# Patient Record
Sex: Male | Born: 1970 | Race: White | Hispanic: No | Marital: Married | Smoking: Never smoker
Health system: Southern US, Community
[De-identification: ages and names within clinical notes are randomized; demographics above are authoritative.]

## PROBLEM LIST (undated history)

## (undated) DIAGNOSIS — R937 Abnormal findings on diagnostic imaging of other parts of musculoskeletal system: Secondary | ICD-10-CM

## (undated) DIAGNOSIS — M1990 Unspecified osteoarthritis, unspecified site: Secondary | ICD-10-CM

## (undated) DIAGNOSIS — R519 Headache, unspecified: Secondary | ICD-10-CM

## (undated) DIAGNOSIS — R161 Splenomegaly, not elsewhere classified: Secondary | ICD-10-CM

## (undated) DIAGNOSIS — K219 Gastro-esophageal reflux disease without esophagitis: Secondary | ICD-10-CM

## (undated) DIAGNOSIS — R51 Headache: Secondary | ICD-10-CM

## (undated) DIAGNOSIS — T7840XA Allergy, unspecified, initial encounter: Secondary | ICD-10-CM

## (undated) DIAGNOSIS — B019 Varicella without complication: Secondary | ICD-10-CM

## (undated) DIAGNOSIS — N281 Cyst of kidney, acquired: Secondary | ICD-10-CM

## (undated) DIAGNOSIS — G43909 Migraine, unspecified, not intractable, without status migrainosus: Secondary | ICD-10-CM

## (undated) HISTORY — DX: Gastro-esophageal reflux disease without esophagitis: K21.9

## (undated) HISTORY — PX: NO PAST SURGERIES: SHX2092

## (undated) HISTORY — DX: Splenomegaly, not elsewhere classified: R16.1

## (undated) HISTORY — DX: Headache: R51

## (undated) HISTORY — DX: Migraine, unspecified, not intractable, without status migrainosus: G43.909

## (undated) HISTORY — DX: Allergy, unspecified, initial encounter: T78.40XA

## (undated) HISTORY — DX: Cyst of kidney, acquired: N28.1

## (undated) HISTORY — DX: Headache, unspecified: R51.9

## (undated) HISTORY — DX: Unspecified osteoarthritis, unspecified site: M19.90

## (undated) HISTORY — DX: Varicella without complication: B01.9

## (undated) HISTORY — DX: Abnormal findings on diagnostic imaging of other parts of musculoskeletal system: R93.7

---

## 2014-05-23 DIAGNOSIS — M222X9 Patellofemoral disorders, unspecified knee: Secondary | ICD-10-CM | POA: Insufficient documentation

## 2014-05-23 DIAGNOSIS — M1990 Unspecified osteoarthritis, unspecified site: Secondary | ICD-10-CM | POA: Insufficient documentation

## 2014-05-23 DIAGNOSIS — M7652 Patellar tendinitis, left knee: Secondary | ICD-10-CM | POA: Insufficient documentation

## 2015-08-22 ENCOUNTER — Encounter: Payer: Self-pay | Admitting: Podiatry

## 2015-08-22 ENCOUNTER — Ambulatory Visit (INDEPENDENT_AMBULATORY_CARE_PROVIDER_SITE_OTHER): Payer: No Typology Code available for payment source

## 2015-08-22 ENCOUNTER — Ambulatory Visit (INDEPENDENT_AMBULATORY_CARE_PROVIDER_SITE_OTHER): Payer: No Typology Code available for payment source | Admitting: Podiatry

## 2015-08-22 VITALS — BP 155/79 | HR 69 | Resp 18

## 2015-08-22 DIAGNOSIS — R52 Pain, unspecified: Secondary | ICD-10-CM

## 2015-08-22 DIAGNOSIS — M109 Gout, unspecified: Secondary | ICD-10-CM

## 2015-08-22 DIAGNOSIS — M10071 Idiopathic gout, right ankle and foot: Secondary | ICD-10-CM

## 2015-08-22 MED ORDER — TRAMADOL HCL 50 MG PO TABS
50.0000 mg | ORAL_TABLET | Freq: Three times a day (TID) | ORAL | Status: DC | PRN
Start: 2015-08-22 — End: 2018-07-05

## 2015-08-22 MED ORDER — METHYLPREDNISOLONE 4 MG PO TBPK: ORAL_TABLET | ORAL | Status: DC

## 2015-08-22 NOTE — Progress Notes (Signed)
   Subjective:    Patient ID: Scott Herrera, male    DOB: 12-22-1970, 44 y.o.   MRN: [id number]  HPI  24-year-old male presents the office as concerns her right ankle pain and swelling which has been ongoing for the last couple of days. He states his ankles been very painful particularly weightbearing and pressure in pain returned and his ankle joint. A painful, swollen and slightly red. He is concerned that he may have gout. He states his been tested previously for gout has been negative although he does get reoccurring swelling painful what appears to be gout flares.he denies any history of injury or,. No tingling or numbness. The pain does not wake him up in my. No other complaints at this time.   Review of Systems  Constitutional: Positive for activity change.  HENT: Positive for sinus pressure and trouble swallowing.   Endocrine:       Excessive thirst  Musculoskeletal: Positive for back pain and gait problem.       Joint pain  Allergic/Immunologic: Positive for environmental allergies.  All other systems reviewed and are negative.      Objective:   Physical Exam AAO x3, NAD DP/PT pulses palpable bilaterally, CRT less than 3 seconds Protective sensation intact with Simms Weinstein monofilament, vibratory sensation intact, Achilles tendon reflex intact There is tenderness to palpation along both the medial and lateral aspect of the posterior to the medial and lateral malleolus. There is no discomfort on the course of the Achilles tendon no defect is noted. Thompson test is negative. There is moderate edema to the ankle in slight increase in warmth compared to the contralateral extremity. There is pain with ankle joint range of motion. No pain with subtalar joint range of motion. There is no specific area pinpoint bony tenderness or pain the vibratory sensation. No otherareas of tenderness to bilateral lower extremities. MMT 4/5 on the right and 5/5 on the left, ROM WNL except for right  ankle which is limited due to pain. No open lesions or pre-ulcerative lesions.  No overlying edema, erythema, increase in warmth to bilateral lower extremities.  No pain with calf compression, swelling, warmth, erythema bilaterally.       Assessment & Plan:  44 year old male right ankle swelling, pain, likely gout -Treatment options discussed including all alternatives, risks, and complications -X-rays were obtained and reviewed with the patient.  -Discussed likely etiology of his symptoms. -Ordered CBC, uric acid, ESR, CRP. -Prescribed a Medrol Dosepak. Start this after lab work is obtained. -Prescribed Voltaren due to pain. -he are to have the cam boot, he can wear as needed. -Follow-up in 2 weeks or sooner if any problems arise. In the meantime, encouraged to call the office with any questions, concerns, change in symptoms.   Celesta Gentile, DPM

## 2015-08-22 NOTE — Patient Instructions (Signed)
Gout °Gout is when your joints become red, sore, and swell (inflamed). This is caused by the buildup of uric acid crystals in the joints. Uric acid is a chemical that is normally in the blood. If the level of uric acid gets too high in the blood, these crystals form in your joints and tissues. Over time, these crystals can form into masses near the joints and tissues. These masses can destroy bone and cause the bone to look misshapen (deformed). °HOME CARE  °· Do not take aspirin for pain. °· Only take medicine as told by your doctor. °· Rest the joint as much as you can. When in bed, keep sheets and blankets off painful areas. °· Keep the sore joints raised (elevated). °· Put warm or cold packs on painful joints. Use of warm or cold packs depends on which works best for you. °· Use crutches if the painful joint is in your leg. °· Drink enough fluids to keep your pee (urine) clear or pale yellow. Limit alcohol, sugary drinks, and drinks with fructose in them. °· Follow your diet instructions. Pay careful attention to how much protein you eat. Include fruits, vegetables, whole grains, and fat-free or low-fat milk products in your daily diet. Talk to your doctor or dietitian about the use of coffee, vitamin C, and cherries. These may help lower uric acid levels. °· Keep a healthy body weight. °GET HELP RIGHT AWAY IF:  °· You have watery poop (diarrhea), throw up (vomit), or have any side effects from medicines. °· You do not feel better in 24 hours, or you are getting worse. °· Your joint becomes suddenly more tender, and you have chills or a fever. °MAKE SURE YOU:  °· Understand these instructions. °· Will watch your condition. °· Will get help right away if you are not doing well or get worse. °Document Released: 09/01/2008 Document Revised: 04/09/2014 Document Reviewed: 07/06/2012 °[employer]® Patient Information ©2015 [employer]. This information is not intended to replace advice given to you by your health care  provider. Make sure you discuss any questions you have with your health care provider. ° °

## 2015-08-23 LAB — CBC WITH DIFFERENTIAL/PLATELET
Basophils Absolute: 0 10*3/uL (ref 0.0–0.2)
Basos: 0 %
EOS (ABSOLUTE): 0.4 10*3/uL (ref 0.0–0.4)
Eos: 4 %
Hematocrit: 43.5 % (ref 37.5–51.0)
Hemoglobin: 15.4 g/dL (ref 12.6–17.7)
Immature Grans (Abs): 0 10*3/uL (ref 0.0–0.1)
Immature Granulocytes: 0 %
Lymphocytes Absolute: 3.1 10*3/uL (ref 0.7–3.1)
Lymphs: 33 %
MCH: 30.8 pg (ref 26.6–33.0)
MCHC: 35.4 g/dL (ref 31.5–35.7)
MCV: 87 fL (ref 79–97)
Monocytes Absolute: 0.8 10*3/uL (ref 0.1–0.9)
Monocytes: 9 %
Neutrophils Absolute: 5 10*3/uL (ref 1.4–7.0)
Neutrophils: 54 %
Platelets: 282 10*3/uL (ref 150–379)
RBC: 5 x10E6/uL (ref 4.14–5.80)
RDW: 14.1 % (ref 12.3–15.4)
WBC: 9.3 10*3/uL (ref 3.4–10.8)

## 2015-08-23 LAB — C-REACTIVE PROTEIN: CRP: 7.3 mg/L — ABNORMAL HIGH (ref 0.0–4.9)

## 2015-08-23 LAB — URIC ACID: Uric Acid: 8.6 mg/dL (ref 3.7–8.6)

## 2015-08-23 LAB — SEDIMENTATION RATE: Sed Rate: 2 mm/hr (ref 0–15)

## 2015-08-26 ENCOUNTER — Telehealth: Payer: Self-pay | Admitting: *Deleted

## 2015-08-26 NOTE — Telephone Encounter (Addendum)
Pt called request bloodwork results from 08/22/2015, gout test.  Dr. Ardelle Anton states values are upper normal likely gout, continue medication.  Called [phone], informed pt's wife, Amy of Dr. Gabriel Rung review of labs and to continue the medication.  Amy states pt, was up all weekend for his son's wedding and has only had a little improvement, will likely rest more now.

## 2015-08-28 ENCOUNTER — Telehealth: Payer: Self-pay | Admitting: *Deleted

## 2015-08-28 ENCOUNTER — Encounter: Payer: Self-pay | Admitting: Podiatry

## 2015-08-28 ENCOUNTER — Ambulatory Visit (INDEPENDENT_AMBULATORY_CARE_PROVIDER_SITE_OTHER): Payer: No Typology Code available for payment source | Admitting: Podiatry

## 2015-08-28 VITALS — BP 143/91 | HR 63 | Temp 97.9°F | Resp 18

## 2015-08-28 DIAGNOSIS — M109 Gout, unspecified: Secondary | ICD-10-CM

## 2015-08-28 DIAGNOSIS — M10071 Idiopathic gout, right ankle and foot: Secondary | ICD-10-CM | POA: Diagnosis not present

## 2015-08-28 MED ORDER — COLCHICINE 0.6 MG PO TABS
0.6000 mg | ORAL_TABLET | Freq: Every day | ORAL | Status: DC
Start: 2015-08-28 — End: 2015-08-28

## 2015-08-28 MED ORDER — COLCHICINE 0.6 MG PO TABS: 0.6000 mg | ORAL_TABLET | Freq: Every day | ORAL | Status: DC

## 2015-08-28 NOTE — Telephone Encounter (Addendum)
Pt's wife, Amy states pt has just completed the steroid dose pack, and this am the foot is extremely painful and swollen.  She asked if pt needed to come in.  I referred wife's message to [facility] and she got pt in to see Dr. Al Corpus today.

## 2015-08-28 NOTE — Progress Notes (Signed)
He presents today for follow-up of his gout to his right foot. He states that after being on the sterile and it helped for a little while but as he ended the sterilely packed his pain returned. He states that he really has no risks for gout and that he has no family history he does not drink and he takes no medications. He states that he has good kidney function per his primary doctor states that the right foot is swollen around the ankle particularly posterior lateral aspect and is painful. He states that this has been going on for years now and is starting to stay longer where as it used to only hang around for 3 days. He states that it has really never been red hot and swollen just swollen and painful. He had blood work performed the last time he was here.  Objective: Vital signs are stable he is alert and oriented 3. Pulses are strongly palpable. Neurologic sensorium is intact. Deep tendon reflexes are intact and brisk bilateral. Muscle strength is normal bilateral. Orthopedic evaluation demonstrates all joints distally for range of motion without crepitus he has moderate edema around the right ankle and over the peroneal tendons. He also has pain on palpation of the deep posterior lateral ankle joint. Review of radiographs today demonstrate ankle joint effusion. Blood work demonstrated a high normal uric acid which very well could be consistent with pain.  Assessment: Gouty capsulitis right ankle.  Plan: Discussed etiology pathology conservative or surgical therapies. I injected the area today with Kenalog and local and aesthetic. I also placed him on colchicine and requested follow-up with him in 2-3 weeks.

## 2015-09-11 ENCOUNTER — Ambulatory Visit: Payer: No Typology Code available for payment source | Admitting: Podiatry

## 2015-11-12 ENCOUNTER — Other Ambulatory Visit: Payer: Self-pay | Admitting: Orthopedic Surgery

## 2015-11-12 DIAGNOSIS — S76302A Unspecified injury of muscle, fascia and tendon of the posterior muscle group at thigh level, left thigh, initial encounter: Secondary | ICD-10-CM

## 2015-12-04 ENCOUNTER — Ambulatory Visit: Payer: No Typology Code available for payment source

## 2016-07-15 ENCOUNTER — Ambulatory Visit (INDEPENDENT_AMBULATORY_CARE_PROVIDER_SITE_OTHER): Payer: BLUE CROSS/BLUE SHIELD | Admitting: Podiatry

## 2016-07-15 ENCOUNTER — Ambulatory Visit (INDEPENDENT_AMBULATORY_CARE_PROVIDER_SITE_OTHER): Payer: BLUE CROSS/BLUE SHIELD

## 2016-07-15 ENCOUNTER — Ambulatory Visit: Payer: No Typology Code available for payment source | Admitting: Podiatry

## 2016-07-15 ENCOUNTER — Encounter: Payer: Self-pay | Admitting: Podiatry

## 2016-07-15 DIAGNOSIS — M10071 Idiopathic gout, right ankle and foot: Secondary | ICD-10-CM

## 2016-07-15 DIAGNOSIS — M109 Gout, unspecified: Secondary | ICD-10-CM

## 2016-07-15 MED ORDER — ALLOPURINOL 100 MG PO TABS: 100.0000 mg | ORAL_TABLET | Freq: Two times a day (BID) | ORAL | 6 refills | Status: DC

## 2016-07-15 MED ORDER — COLCHICINE 0.6 MG PO TABS: ORAL_TABLET | ORAL | 3 refills | Status: DC

## 2016-07-15 MED ORDER — COLCHICINE 0.6 MG PO TABS: 0.6000 mg | ORAL_TABLET | Freq: Every day | ORAL | 3 refills | Status: DC

## 2016-07-15 NOTE — Progress Notes (Signed)
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 States that over the past year he has had multiple flareups and utilizes his colchicine during that time. He recalls that we discussed a medication that he can take to prevent the gout from flaring.  Objective: Vital signs are stable he is alert and oriented 3. Pulses are palpable. No change from previous evaluation a see no open lesions or wounds. Mild tenderness on palpation of the first metatarsophalangeal joint but the majority of his pain from gout was around the ankle.  Assessment: Gouty capsulitis.  Plan: Start him on 200 mg of allopurinol and colchicine. I will follow-up with him as needed. He is notify us with a flare.

## 2018-07-05 ENCOUNTER — Ambulatory Visit (INDEPENDENT_AMBULATORY_CARE_PROVIDER_SITE_OTHER): Payer: BLUE CROSS/BLUE SHIELD | Admitting: Internal Medicine

## 2018-07-05 ENCOUNTER — Encounter: Payer: Self-pay | Admitting: Internal Medicine

## 2018-07-05 VITALS — BP 138/71 | HR 64 | Temp 98.4°F | Ht 71.0 in | Wt 242.8 lb

## 2018-07-05 DIAGNOSIS — M5136 Other intervertebral disc degeneration, lumbar region: Secondary | ICD-10-CM

## 2018-07-05 DIAGNOSIS — R161 Splenomegaly, not elsewhere classified: Secondary | ICD-10-CM

## 2018-07-05 DIAGNOSIS — M25511 Pain in right shoulder: Secondary | ICD-10-CM

## 2018-07-05 DIAGNOSIS — M545 Low back pain, unspecified: Secondary | ICD-10-CM

## 2018-07-05 DIAGNOSIS — M109 Gout, unspecified: Secondary | ICD-10-CM

## 2018-07-05 DIAGNOSIS — M549 Dorsalgia, unspecified: Secondary | ICD-10-CM

## 2018-07-05 DIAGNOSIS — M25512 Pain in left shoulder: Secondary | ICD-10-CM

## 2018-07-05 DIAGNOSIS — M2550 Pain in unspecified joint: Secondary | ICD-10-CM | POA: Diagnosis not present

## 2018-07-05 DIAGNOSIS — M5126 Other intervertebral disc displacement, lumbar region: Secondary | ICD-10-CM

## 2018-07-05 DIAGNOSIS — Z1329 Encounter for screening for other suspected endocrine disorder: Secondary | ICD-10-CM

## 2018-07-05 DIAGNOSIS — G8929 Other chronic pain: Secondary | ICD-10-CM

## 2018-07-05 DIAGNOSIS — Z1159 Encounter for screening for other viral diseases: Secondary | ICD-10-CM

## 2018-07-05 DIAGNOSIS — Z23 Encounter for immunization: Secondary | ICD-10-CM | POA: Diagnosis not present

## 2018-07-05 DIAGNOSIS — R1310 Dysphagia, unspecified: Secondary | ICD-10-CM

## 2018-07-05 DIAGNOSIS — Z1389 Encounter for screening for other disorder: Secondary | ICD-10-CM | POA: Diagnosis not present

## 2018-07-05 DIAGNOSIS — M51369 Other intervertebral disc degeneration, lumbar region without mention of lumbar back pain or lower extremity pain: Secondary | ICD-10-CM | POA: Insufficient documentation

## 2018-07-05 DIAGNOSIS — E559 Vitamin D deficiency, unspecified: Secondary | ICD-10-CM

## 2018-07-05 DIAGNOSIS — Z8739 Personal history of other diseases of the musculoskeletal system and connective tissue: Secondary | ICD-10-CM

## 2018-07-05 DIAGNOSIS — N281 Cyst of kidney, acquired: Secondary | ICD-10-CM

## 2018-07-05 DIAGNOSIS — Z0184 Encounter for antibody response examination: Secondary | ICD-10-CM

## 2018-07-05 NOTE — Progress Notes (Signed)
Pre visit review using our clinic review tool, if applicable. No additional management support is needed unless otherwise documented below in the visit note. 

## 2018-07-05 NOTE — Progress Notes (Signed)
Chief Complaint  Patient presents with  . Establish Care   New patient with wife  1. Had MRI lumbar 06/20/18 for chronic low back pain f/u [facility] on Robaxin and Naproxen  2. C/o b/l shoulder pain and low back pain. 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 H/o gout not taking allopurinol nor cochicine and no active gout flare  3. C/o dysphagia with dry meats and feeling like getting "choked" or "airlock" of food esp meats and breath. He will have difficulty swallowing to pt where he cant breath and has to try walking to get it to go down throat.  He declines GI referral for now but agreeable to do Barium swallow  4. Mild splenomegaly noted on MRI lumbar he reports occasionally he drinks 1-2 beers. Also noted large left kidney cyst on MRI lumbar denies blood in urine    Review of Systems  Constitutional: Negative for weight loss.  HENT: Negative for hearing loss.   Eyes: Negative for blurred vision.  Respiratory: Negative for shortness of breath.   Cardiovascular: Negative for chest pain.  Gastrointestinal: Negative for abdominal pain.  Genitourinary: Negative for hematuria.  Musculoskeletal: Positive for back pain, joint pain and myalgias.  Skin: Negative for rash.  Neurological: Negative for headaches.  Psychiatric/Behavioral: Negative for depression.   Past Medical History:  Diagnosis Date  . Abnormal MRI, lumbar spine   . Allergy   . Arthritis   . Chicken pox   . Cyst of left kidney   . Frequent headaches   . GERD (gastroesophageal reflux disease)   . Migraine   . Splenomegaly    Past Surgical History:  Procedure Laterality Date  . NO PAST SURGERIES     Family History  Problem Relation Age of Onset  . Cancer Father        colostomy ? cause   . Asthma Daughter   . Asthma Son   . Arthritis Maternal Grandmother   . Cancer Maternal Grandmother        breast and colon   . Birth defects Maternal Grandmother   . Cancer  Maternal Grandfather        prostate   . Hearing loss Maternal Grandfather    Social History   Socioeconomic History  . Marital status: Married    Spouse name: Not on file  . Number of children: Not on file  . Years of education: Not on file  . Highest education level: Not on file  Occupational History  . Not on file  Social Needs  . Financial resource strain: Not on file  . Food insecurity:    Worry: Not on file    Inability: Not on file  . Transportation needs:    Medical: Not on file    Non-medical: Not on file  Tobacco Use  . Smoking status: Never Smoker  . Smokeless tobacco: Never Used  Substance and Sexual Activity  . Alcohol use: No    Alcohol/week: 0.0 oz  . Drug use: No  . Sexual activity: Yes    Comment: wife   Lifestyle  . Physical activity:    Days per week: Not on file    Minutes per session: Not on file  . Stress: Not on file  Relationships  . Social connections:    Talks on phone: Not on file    Gets together: Not on file    Attends religious service: Not on file  Active member of club or organization: Not on file    Attends meetings of clubs or organizations: Not on file    Relationship status: Not on file  . Intimate partner violence:    Fear of current or ex partner: Not on file    Emotionally abused: Not on file    Physically abused: Not on file    Forced sexual activity: Not on file  Other Topics Concern  . Not on file  Social History Narrative   Drinks etoh sparingly    Never smoker    Owns gusn    Does not wear seat belt    Safe in relationship    2 kids boy and girl, married    [employer]s [employer] 12[employer] grade [employer]   Current Meds  Medication Sig  . methocarbamol (ROBAXIN) 500 MG tablet Take 500 mg by mouth 2 (two) times daily.  . naproxen (NAPROSYN) 375 MG tablet Take 375 mg by mouth 2 (two) times daily.   No Known Allergies No results found for this or any previous visit (from the past 2160 hour(s)). Objective  Body mass index  is 33.86 kg/m. Wt Readings from Last 3 Encounters:  07/05/18 242 lb 12.8 oz (110.1 kg)   Temp Readings from Last 3 Encounters:  07/05/18 98.4 F (36.9 C) (Oral)  08/28/15 97.9 F (36.6 C)   BP Readings from Last 3 Encounters:  07/05/18 138/71  08/28/15 (!) 143/91  08/22/15 (!) 155/79   Pulse Readings from Last 3 Encounters:  07/05/18 64  08/28/15 63  08/22/15 69    Physical Exam  Constitutional: He is oriented to person, place, and time. Vital signs are normal. He appears well-developed and well-nourished. He is cooperative.  HENT:  Head: Normocephalic and atraumatic.  Mouth/Throat: Oropharynx is clear and moist and mucous membranes are normal.  Eyes: Pupils are equal, round, and reactive to light. Conjunctivae are normal.  Cardiovascular: Normal rate, regular rhythm and normal heart sounds.  Pulmonary/Chest: Effort normal and breath sounds normal.  Abdominal: Soft. Normal appearance and bowel sounds are normal. He exhibits no distension. There is no splenomegaly. There is no tenderness.  No splenomegaly on exam   Neurological: He is alert and oriented to person, place, and time. Gait normal.  Skin: Skin is warm, dry and intact.  Psychiatric: He has a normal mood and affect. His speech is normal and behavior is normal. Judgment and thought content normal. Cognition and memory are normal.  Nursing note and vitals reviewed.   Assessment   1. Mild splenomegaly noted on MRI lumbar 06/20/18. tx'ed steroids by ortho 06/2018 x 1 week   2. Large cyst in left kidney noted MRI lumbar 06/20/18    3. Chronic low back pain DDD and herniated discs -06/20/18 MRI lumbar multilevel DDD, mild facet arthropathy, disc bulging multiple levels with spinal canal narrowing L2/3 through L4/5 most pronounced L3/4. Mild neural foraminal narrowing L5-S1, disc bulge L5/S1 abuts descending S1 nerve roods b/l   4. H/o gout no active flare   5. H/o b/l shoulder pain   6. Dysphagia   7. HM Plan   1  and 2. CT ab/pelvis with and w/o contrast  Check labs today  3.  F/u [facility]  Prn meds  On robaxin 500 mg qhs prn and naproxen 375 bid  Disc reduce heavy lifting  4.  Not taking allopurinol 10 mg bid or colchicine 0.6  5. Check labs if continues f/u [facility]  6. Check barium  swallow  Declines GI EGD for now  7.  Declines flu shot  Tdap given today  Check MMR and hep b/C status  Declines HIV   Will need to check fasting lipid in future  Consider PSA and colonoscopy age 64  Last eye exam 2016 [facility]  Dentist Dr. Leighton Roach  Neurology [facility] Prior PCP Dr. Gus Puma [facility]    "I spent 60 minutes face-to face with patient with greater than 50% of time spent counseling and/or in coordination of care as above labs, CT ab/pelvis w/u splenomegaly, barium swallow and complex decision making  Provider: Dr. Olivia Mackie McLean-Scocuzza-Internal Medicine

## 2018-07-05 NOTE — Patient Instructions (Addendum)
F/u in 1 month sooner if needed   Dysphagia Dysphagia is trouble swallowing. This condition occurs when solids and liquids stick in a person's throat on the way down to the stomach, or when food takes longer to get to the stomach. You may have problems swallowing food, liquids, or both. You may also have pain while trying to swallow. It may take you more time and effort to swallow something. What are the causes? This condition is caused by:  Problems with the muscles. They may make it difficult for you to move food and liquids through the tube that connects your mouth to your stomach (esophagus). You may have ulcers, scar tissue, or inflammation that blocks the normal passage of food and liquids. Causes of these problems include: ? Acid reflux from your stomach into your esophagus (gastroesophageal reflux). ? Infections. ? Radiation treatment for cancer. ? Medicines taken without enough fluids to wash them down into your stomach.  Nerve problems. These prevent signals from being sent to the muscles of your esophagus to squeeze (contract) and move what you swallow down to your stomach.  Globus pharyngeus. This is a common problem that involves feeling like something is stuck in the throat or a sense of trouble with swallowing even though nothing is wrong with the swallowing passages.  Stroke. This can affect the nerves and make it difficult to swallow.  Certain conditions, such as cerebral palsy or Parkinson disease.  What are the signs or symptoms? Common symptoms of this condition include:  A feeling that solids or liquids are stuck in your throat on the way down to the stomach.  Food taking too long to get to the stomach.  Other symptoms include:  Food moving back from your stomach to your mouth (regurgitation).  Noises coming from your throat.  Chest discomfort with swallowing.  A feeling of fullness when swallowing.  Drooling, especially when the throat is blocked.  Pain  while swallowing.  Heartburn.  Coughing or gagging while trying to swallow.  How is this diagnosed? This condition is diagnosed by:  Barium X-ray. In this test, you swallow a white substance (contrast medium)that sticks to the inside of your esophagus. X-ray images are then taken.  Endoscopy. In this test, a flexible telescope is inserted down your throat to look at your esophagus and your stomach.  CT scans and MRI.  How is this treated? Treatment for dysphagia depends on the cause of the condition:  If the dysphagia is caused by acid reflux or infection, medicines may be used. They may include antibiotics and heartburn medicines.  If the dysphagia is caused by problems with your muscles, swallowing therapy may be used to help you strengthen your swallowing muscles. You may have to do specific exercises to strengthen the muscles or stretch them.  If the dysphagia is caused by a blockage or mass, procedures to remove the blockage may be done. You may need surgery and a feeding tube.  You may need to make diet changes. Ask your health care provider for specific instructions. Follow these instructions at home: Eating and drinking  Try to eat soft food that is easier to swallow.  Follow any diet changes as told by your health care provider.  Cut your food into small pieces and eat slowly.  Eat and drink only when you are sitting upright.  Do not drink alcohol or caffeine. If you need help quitting, ask your health care provider. General instructions  Check your weight every day to make  sure you are not losing weight.  Take over-the-counter and prescription medicines only as told by your health care provider.  If you were prescribed an antibiotic medicine, take it as told by your health care provider. Do not stop taking the antibiotic even if you start to feel better.  Do not use any products that contain nicotine or tobacco, such as cigarettes and e-cigarettes. If you need  help quitting, ask your health care provider.  Keep all follow-up visits as told by your health care provider. This is important. Contact a health care provider if:  You lose weight because you cannot swallow.  You cough when you drink liquids (aspiration).  You cough up partially digested food. Get help right away if:  You cannot swallow your saliva.  You have shortness of breath or a fever, or both.  You have a hoarse voice and also have trouble swallowing. Summary  Dysphagia is trouble swallowing. This condition occurs when solids and liquids stick in a person's throat on the way down to the stomach, or when food takes longer to get to the stomach.  Dysphagia has many possible causes and symptoms.  Treatment for dysphagia depends on the cause of the condition. This information is not intended to replace advice given to you by your health care provider. Make sure you discuss any questions you have with your health care provider. Document Released: 11/20/2000 Document Revised: 11/12/2016 Document Reviewed: 11/12/2016 Elsevier Interactive Patient Education  2017 [employer]   Low-Purine Diet Purines are compounds that affect the level of uric acid in your body. A low-purine diet is a diet that is low in purines. Eating a low-purine diet can prevent the level of uric acid in your body from getting too high and causing gout or kidney stones or both. What do I need to know about this diet?  Choose low-purine foods. Examples of low-purine foods are listed in the next section.  Drink plenty of fluids, especially water. Fluids can help remove uric acid from your body. Try to drink 8-16 cups (1.9-3.8 L) a day.  Limit foods high in fat, especially saturated fat, as fat makes it harder for the body to get rid of uric acid. Foods high in saturated fat include pizza, cheese, ice cream, whole milk, fried foods, and gravies. Choose foods that are lower in fat and lean sources of protein.  Use olive oil when cooking as it contains healthy fats that are not high in saturated fat.  Limit alcohol. Alcohol interferes with the elimination of uric acid from your body. If you are having a gout attack, avoid all alcohol.  Keep in mind that different people's bodies react differently to different foods. You will probably learn over time which foods do or do not affect you. If you discover that a food tends to cause your gout to flare up, avoid eating that food. You can more freely enjoy foods that do not cause problems. If you have any questions about a food item, talk to your dietitian or health care provider. Which foods are low, moderate, and high in purines? The following is a list of foods that are low, moderate, and high in purines. You can eat any amount of the foods that are low in purines. You may be able to have small amounts of foods that are moderate in purines. Ask your health care provider how much of a food moderate in purines you can have. Avoid foods high in purines. Grains  Foods low in  purines: Enriched white bread, pasta, rice, cake, cornbread, popcorn.  Foods moderate in purines: Whole-grain breads and cereals, wheat germ, bran, oatmeal. Uncooked oatmeal. Dry wheat bran or wheat germ.  Foods high in purines: Pancakes, [place] toast, biscuits, muffins. Vegetables  Foods low in purines: All vegetables, except those that are moderate in purines.  Foods moderate in purines: Asparagus, cauliflower, spinach, mushrooms, green peas. Fruits  All fruits are low in purines. Meats and other Protein Foods  Foods low in purines: Eggs, nuts, peanut butter.  Foods moderate in purines: 80-90% lean beef, lamb, veal, pork, poultry, fish, eggs, peanut butter, nuts. Crab, lobster, oysters, and shrimp. Cooked dried beans, peas, and lentils.  Foods high in purines: Anchovies, sardines, herring, mussels, tuna, codfish, scallops, trout, and haddock. Scott Herrera. Organ meats (such as liver or  kidney). Tripe. Game meat. Goose. Sweetbreads. Dairy  All dairy foods are low in purines. Low-fat and fat-free dairy products are best because they are low in saturated fat. Beverages  Drinks low in purines: Water, carbonated beverages, tea, coffee, cocoa.  Drinks moderate in purines: Soft drinks and other drinks sweetened with high-fructose corn syrup. Juices. To find whether a food or drink is sweetened with high-fructose corn syrup, look at the ingredients list.  Drinks high in purines: Alcoholic beverages (such as beer). Condiments  Foods low in purines: Salt, herbs, olives, pickles, relishes, vinegar.  Foods moderate in purines: Butter, margarine, oils, mayonnaise. Fats and Oils  Foods low in purines: All types, except gravies and sauces made with meat.  Foods high in purines: Gravies and sauces made with meat. Other Foods  Foods low in purines: Sugars, sweets, gelatin. Cake. Soups made without meat.  Foods moderate in purines: Meat-based or fish-based soups, broths, or bouillons. Foods and drinks sweetened with high-fructose corn syrup.  Foods high in purines: High-fat desserts (such as ice cream, cookies, cakes, pies, doughnuts, and chocolate). Contact your dietitian for more information on foods that are not listed here. This information is not intended to replace advice given to you by your health care provider. Make sure you discuss any questions you have with your health care provider. Document Released: 03/20/2011 Document Revised: 04/30/2016 Document Reviewed: 10/30/2013 Elsevier Interactive Patient Education  2017 Elsevier Inc.  Enlarged Spleen An enlarged spleen (splenomegaly) is when the spleen is larger than normal. This condition is usually noticed when the spleen is almost twice its normal size. The spleen is an organ that is located in the upper left area of the abdomen, just under the ribs. The spleen is like a storage unit for red blood cells, and it also  works to filter and clean the blood. It destroys cells that are damaged or worn out. The spleen is also important for fighting disease. An enlarged spleen is usually a sign of another health problem. What are the causes? This condition may be caused by:  Mononucleosis and some other viral infections.  Infection with certain bacteria or parasites.  Liver failure (cirrhosis) and other liver diseases.  Blood diseases, such as hemolytic anemia.  An overactive spleen (hypersplenism).  Blood cancers, such as leukemia or Hodgkin disease.  Metabolic disorders, such as Gaucher disease or Niemann-Pick disease.  Tumors and cysts.  Pressure or blood clots in the veins of the spleen.  Connective tissue disorders, such as lupus or rheumatoid arthritis.  What are the signs or symptoms? Symptoms of this condition include:  Pain in the upper left part of the abdomen. The pain may spread to the left  shoulder or get worse when you take a breath.  Feeling full without eating or after eating only a small amount.  Feeling tired.  Chronic infections.  Bleeding or bruising easily.  In some cases, there are no symptoms. How is this diagnosed? This condition may be diagnosed during a physical exam when the health care provider feels the left upper part of your abdomen. You may also have tests, such as:  Blood tests to check red and white blood cells and other proteins and enzymes.  Imaging tests, such as an abdominal ultrasound, CT scan, or MRI.  Taking a tissue sample (biopsy) of the liver or bone marrow if there is concern that it is the cause of an enlarged spleen.  How is this treated? Treatment for this condition depends on the cause. Treatment aims to manage the conditions that cause swelling of the spleen and reduce the size of the spleen. Treatment may include:  Medicines to treat infection or disease.  Radiation therapy.  Blood transfusions.  Vaccinations.  If these  treatments do not help or if the cause cannot be found, surgery to remove the spleen (splenectomy) may be recommended. Follow these instructions at home:  Take over-the-counter and prescription medicines only as told by your health care provider.  If you were prescribed an antibiotic medicine, take it as told by your health care provider. Do not stop taking the antibiotic even if you start to feel better.  Follow instructions from your health care provider about limiting your activities. To avoid injury or a ruptured spleen, make sure you: ? Avoid contact sports. ? Wear a seat belt in the car.  Keep all follow-up visits as told by your health care provider. This is important. Contact a health care provider if:  Your symptoms do not improve as expected.  You have a fever or chills.  You feel generally ill.  You have increased pain when you take in a breath. Get help right away if:  You experience an injury or impact to the spleen area.  Your abdominal pain becomes severe.  You feel dizzy or you faint.  You feel very weak.  You have cold and clammy skin.  You have sweating for no reason.  You have chest pain or difficulty breathing. This information is not intended to replace advice given to you by your health care provider. Make sure you discuss any questions you have with your health care provider. Document Released: 05/13/2010 Document Revised: 04/30/2016 Document Reviewed: 05/13/2015 Elsevier Interactive Patient Education  2018 [employer].

## 2018-07-06 ENCOUNTER — Encounter: Payer: Self-pay | Admitting: Internal Medicine

## 2018-07-06 LAB — URINALYSIS, ROUTINE W REFLEX MICROSCOPIC
Bilirubin, UA: NEGATIVE
Glucose, UA: NEGATIVE
Ketones, UA: NEGATIVE
Leukocytes, UA: NEGATIVE
Nitrite, UA: NEGATIVE
Protein, UA: NEGATIVE
RBC, UA: NEGATIVE
Specific Gravity, UA: 1.009 (ref 1.005–1.030)
Urobilinogen, Ur: 0.2 mg/dL (ref 0.2–1.0)
pH, UA: 7 (ref 5.0–7.5)

## 2018-07-06 LAB — COMPREHENSIVE METABOLIC PANEL
ALT: 36 U/L (ref 0–53)
AST: 21 U/L (ref 0–37)
Albumin: 4.5 g/dL (ref 3.5–5.2)
Alkaline Phosphatase: 76 U/L (ref 39–117)
BUN: 12 mg/dL (ref 6–23)
CO2: 27 mEq/L (ref 19–32)
Calcium: 9.7 mg/dL (ref 8.4–10.5)
Chloride: 103 mEq/L (ref 96–112)
Creatinine, Ser: 1.07 mg/dL (ref 0.40–1.50)
GFR: 78.58 mL/min (ref >60.00)
Glucose, Bld: 98 mg/dL (ref 70–99)
Potassium: 4 mEq/L (ref 3.5–5.1)
Sodium: 138 mEq/L (ref 135–145)
Total Bilirubin: 0.7 mg/dL (ref 0.2–1.2)
Total Protein: 7.5 g/dL (ref 6.0–8.3)

## 2018-07-06 LAB — CBC WITH DIFFERENTIAL/PLATELET
Basophils Absolute: 0.1 10*3/uL (ref 0.0–0.1)
Basophils Relative: 0.8 % (ref 0.0–3.0)
Eosinophils Absolute: 0.5 10*3/uL (ref 0.0–0.7)
Eosinophils Relative: 6.6 % — ABNORMAL HIGH (ref 0.0–5.0)
HCT: 46.1 % (ref 39.0–52.0)
Hemoglobin: 16.1 g/dL (ref 13.0–17.0)
Lymphocytes Relative: 33 % (ref 12.0–46.0)
Lymphs Abs: 2.4 10*3/uL (ref 0.7–4.0)
MCHC: 35 g/dL (ref 30.0–36.0)
MCV: 87.1 fl (ref 78.0–100.0)
Monocytes Absolute: 0.5 10*3/uL (ref 0.1–1.0)
Monocytes Relative: 7.5 % (ref 3.0–12.0)
Neutro Abs: 3.7 10*3/uL (ref 1.4–7.7)
Neutrophils Relative %: 52.1 % (ref 43.0–77.0)
Platelets: 286 10*3/uL (ref 150.0–400.0)
RBC: 5.29 Mil/uL (ref 4.22–5.81)
RDW: 13.6 % (ref 11.5–15.5)
WBC: 7.2 10*3/uL (ref 4.0–10.5)

## 2018-07-06 LAB — T4, FREE: Free T4: 0.99 ng/dL (ref 0.60–1.60)

## 2018-07-06 LAB — SEDIMENTATION RATE: Sed Rate: 11 mm/hr (ref 0–15)

## 2018-07-06 LAB — URIC ACID: Uric Acid, Serum: 9.3 mg/dL — ABNORMAL HIGH (ref 4.0–7.8)

## 2018-07-06 LAB — VITAMIN D 25 HYDROXY (VIT D DEFICIENCY, FRACTURES): VITD: 22.85 ng/mL — ABNORMAL LOW (ref 30.00–100.00)

## 2018-07-06 LAB — C-REACTIVE PROTEIN: CRP: <0.1 mg/dL — ABNORMAL LOW (ref 0.5–20.0)

## 2018-07-06 LAB — TSH: TSH: 1.97 u[IU]/mL (ref 0.35–4.50)

## 2018-07-07 LAB — ANA: Anti Nuclear Antibody(ANA): NEGATIVE

## 2018-07-07 LAB — HEPATITIS B SURFACE ANTIGEN: Hepatitis B Surface Ag: NONREACTIVE

## 2018-07-07 LAB — CYCLIC CITRUL PEPTIDE ANTIBODY, IGG: Cyclic Citrullin Peptide Ab: <16 UNITS

## 2018-07-07 LAB — HEPATITIS C ANTIBODY
Hepatitis C Ab: NONREACTIVE
SIGNAL TO CUT-OFF: 0.02 (ref <1.00)

## 2018-07-07 LAB — HEPATITIS B SURFACE ANTIBODY, QUANTITATIVE: Hepatitis B-Post: <5 m[IU]/mL — ABNORMAL LOW (ref >=10)

## 2018-07-07 LAB — MEASLES/MUMPS/RUBELLA IMMUNITY
Mumps IgG: 9.59 AU/mL — ABNORMAL LOW
Rubella: 1.61 index
Rubeola IgG: >300 AU/mL

## 2018-07-12 ENCOUNTER — Encounter: Payer: Self-pay | Admitting: Internal Medicine

## 2018-07-12 ENCOUNTER — Encounter (INDEPENDENT_AMBULATORY_CARE_PROVIDER_SITE_OTHER): Payer: Self-pay

## 2018-07-12 NOTE — Telephone Encounter (Signed)
His barium swallow is scheduled for Monday 8/12. His insurance is not authorizing ct ab/pelvis. It will approve the abdomen only. Would you like to do the CT abdomen only or do you want to do a peer to peer? If you want the abdomen only then you will need to change the order for me. Let me know! Melissa

## 2018-07-13 ENCOUNTER — Other Ambulatory Visit: Payer: Self-pay | Admitting: Internal Medicine

## 2018-07-13 DIAGNOSIS — N281 Cyst of kidney, acquired: Secondary | ICD-10-CM

## 2018-07-13 DIAGNOSIS — R161 Splenomegaly, not elsewhere classified: Secondary | ICD-10-CM

## 2018-07-18 ENCOUNTER — Ambulatory Visit
Admission: RE | Admit: 2018-07-18 | Discharge: 2018-07-18 | Disposition: A | Discharge status: Home / Self Care | Payer: BLUE CROSS/BLUE SHIELD | Source: Ambulatory Visit | Attending: Internal Medicine | Admitting: Internal Medicine

## 2018-07-18 DIAGNOSIS — R1310 Dysphagia, unspecified: Secondary | ICD-10-CM | POA: Insufficient documentation

## 2018-07-18 DIAGNOSIS — K219 Gastro-esophageal reflux disease without esophagitis: Secondary | ICD-10-CM | POA: Diagnosis not present

## 2018-07-20 ENCOUNTER — Ambulatory Visit: Payer: BLUE CROSS/BLUE SHIELD

## 2018-08-05 ENCOUNTER — Ambulatory Visit: Payer: BLUE CROSS/BLUE SHIELD | Admitting: Internal Medicine

## 2018-09-05 ENCOUNTER — Encounter

## 2018-09-05 ENCOUNTER — Encounter: Payer: Self-pay | Admitting: Podiatry

## 2018-09-05 ENCOUNTER — Ambulatory Visit (INDEPENDENT_AMBULATORY_CARE_PROVIDER_SITE_OTHER): Payer: BLUE CROSS/BLUE SHIELD | Admitting: Podiatry

## 2018-09-05 ENCOUNTER — Ambulatory Visit (INDEPENDENT_AMBULATORY_CARE_PROVIDER_SITE_OTHER): Payer: BLUE CROSS/BLUE SHIELD

## 2018-09-05 DIAGNOSIS — M7751 Other enthesopathy of right foot: Secondary | ICD-10-CM

## 2018-09-05 DIAGNOSIS — G5620 Lesion of ulnar nerve, unspecified upper limb: Secondary | ICD-10-CM | POA: Insufficient documentation

## 2018-09-05 DIAGNOSIS — M7661 Achilles tendinitis, right leg: Secondary | ICD-10-CM | POA: Diagnosis not present

## 2018-09-05 DIAGNOSIS — M779 Enthesopathy, unspecified: Secondary | ICD-10-CM

## 2018-09-05 DIAGNOSIS — M25539 Pain in unspecified wrist: Secondary | ICD-10-CM | POA: Insufficient documentation

## 2018-09-05 NOTE — Progress Notes (Signed)
He presents today after having not seen him for several months now.  States that he has a new family doctor who just did blood work not too long ago and demonstrated that he had an elevated uric acid and he noticed that his ankle started to hurt at that time.  Objective: Vital signs are stable he is alert and oriented x3.  Pulses are palpable.  He has moderate to severe pain on palpation of the Achilles tendon and deep to the lateral process of the talus.  Lateral radiograph does demonstrate what appears to be a fracture of the lateral process of the talus.  Also plantar fascial inflammation.  Assessment: Chronic pain possibly associated with gout ankle.  Cannot rule out a tear of the Achilles tendon and of the plantar fascia.  Plan: MRI of the Achilles tendon deep ankle and subtalar joint as well as the plantar fascia.  May need to consider getting him on Colcrys colchicine.  Allopurinol caused him to not feel well.

## 2018-09-06 NOTE — Addendum Note (Signed)
Addended by: Geraldine Contras D on: 09/06/2018 11:41 AM   Modules accepted: Orders

## 2018-09-12 ENCOUNTER — Telehealth: Payer: Self-pay | Admitting: Podiatry

## 2018-09-12 NOTE — Telephone Encounter (Signed)
I'm calling in regards to follow up on my husbands MRI. If you could call me back with that information at [phone].

## 2018-10-04 ENCOUNTER — Ambulatory Visit: Payer: BLUE CROSS/BLUE SHIELD | Admitting: Internal Medicine

## 2019-05-11 ENCOUNTER — Other Ambulatory Visit: Payer: Self-pay

## 2019-05-11 ENCOUNTER — Emergency Department: Payer: BLUE CROSS/BLUE SHIELD

## 2019-05-11 ENCOUNTER — Emergency Department
Admission: EM | Admit: 2019-05-11 | Discharge: 2019-05-12 | Disposition: A | Discharge status: Home / Self Care | Payer: BLUE CROSS/BLUE SHIELD | Attending: Emergency Medicine | Admitting: Emergency Medicine

## 2019-05-11 DIAGNOSIS — R55 Syncope and collapse: Secondary | ICD-10-CM | POA: Diagnosis present

## 2019-05-11 DIAGNOSIS — I951 Orthostatic hypotension: Secondary | ICD-10-CM

## 2019-05-11 LAB — CBC
HCT: 43.1 % (ref 39.0–52.0)
Hemoglobin: 15.5 g/dL (ref 13.0–17.0)
MCH: 30.3 pg (ref 26.0–34.0)
MCHC: 36 g/dL (ref 30.0–36.0)
MCV: 84.3 fL (ref 80.0–100.0)
Platelets: 275 10*3/uL (ref 150–400)
RBC: 5.11 MIL/uL (ref 4.22–5.81)
RDW: 13.2 % (ref 11.5–15.5)
WBC: 11.9 10*3/uL — ABNORMAL HIGH (ref 4.0–10.5)
nRBC: 0 % (ref 0.0–0.2)

## 2019-05-11 LAB — TROPONIN I: Troponin I: <0.03 ng/mL (ref <0.03)

## 2019-05-11 MED ORDER — SODIUM CHLORIDE 0.9% FLUSH: 3.0000 mL | Freq: Once | INTRAVENOUS | Status: DC

## 2019-05-11 NOTE — ED Provider Notes (Signed)
[facility] Emergency Department Provider Note   ____________________________________________   First MD Initiated Contact with Patient 05/11/19 2320     (approximate)  I have reviewed the triage vital signs and the nursing notes.   HISTORY  Chief Complaint Loss of Consciousness    HPI Scott Herrera is a 48 y.o. male brought to the ED from home via EMS status post syncopal episode.  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  Had dinner with his wife and subsequently had a bowel movement.  Felt lightheaded when he got up off the commode and tried to walk down the hallway but had LOC.  He was called by his wife so did not strike his head.  EMS reports patient was orthostatic upon their arrival.  His heart rate dropped to 28 and 1 mg atropine was given.  Patient had 2 lidocaine patches on his right ankle for a gouty flareup.  Otherwise patient denies recent fever, cough, chest pain, shortness of breath, abdominal pain, nausea, vomiting or diarrhea.  Denies recent travel or exposure to persons diagnosed with coronavirus.        Past Medical History:  Diagnosis Date  . Abnormal MRI, lumbar spine   . Allergy   . Arthritis   . Chicken pox   . Cyst of left kidney   . Frequent headaches   . GERD (gastroesophageal reflux disease)   . Migraine   . Splenomegaly     Patient Active Problem List   Diagnosis Date Noted  . Lesion of ulnar nerve 09/05/2018  . Wrist joint pain 09/05/2018  . DDD (degenerative disc disease), lumbar 07/05/2018  . Lumbar herniated disc 07/05/2018  . Splenomegaly 07/05/2018  . Cyst of left kidney 07/05/2018  . Chronic back pain 07/05/2018  . History of gout 07/05/2018  . Shoulder pain, bilateral 07/05/2018  . Dysphagia 07/05/2018  . Arthritis, senescent 05/23/2014  . Patellar tendinitis of left knee 05/23/2014  . Patellofemoral stress syndrome 05/23/2014    Past Surgical History:  Procedure  Laterality Date  . NO PAST SURGERIES      Prior to Admission medications   Not on File    Allergies Patient has no known allergies.  Family History  Problem Relation Age of Onset  . Cancer Father        colostomy ? cause   . Asthma Daughter   . Asthma Son   . Arthritis Maternal Grandmother   . Cancer Maternal Grandmother        breast and colon   . Birth defects Maternal Grandmother   . Cancer Maternal Grandfather        prostate   . Hearing loss Maternal Grandfather     Social History Social History   Tobacco Use  . Smoking status: Never Smoker  . Smokeless tobacco: Never Used  Substance Use Topics  . Alcohol use: No    Alcohol/week: 0.0 standard drinks  . Drug use: No    Review of Systems  Constitutional: No fever/chills Eyes: No visual changes. ENT: No sore throat. Cardiovascular: Denies chest pain. Respiratory: Denies shortness of breath. Gastrointestinal: No abdominal pain.  No nausea, no vomiting.  No diarrhea.  No constipation. Genitourinary: Negative for dysuria. Musculoskeletal: Negative for back pain. Skin: Negative for rash. Neurological: Positive for syncope.  Negative for headaches, focal weakness or numbness.   ____________________________________________   PHYSICAL EXAM:  VITAL SIGNS: ED Triage Vitals [05/11/19 2254]  Enc Vitals  Group     BP      Pulse      Resp      Temp      Temp src      SpO2      Weight 230 lb (104.3 kg)     Height  (1.803 m)     Head Circumference      Peak Flow      Pain Score 4     Pain Loc      Pain Edu?      Excl. in GC?     Constitutional: Alert and oriented. Well appearing and in no acute distress. Eyes: Conjunctivae are normal. PERRL. EOMI. Head: Atraumatic. Nose: No congestion/rhinnorhea. Mouth/Throat: Mucous membranes are moist.  Oropharynx non-erythematous. Neck: No stridor.  No carotid bruits. Cardiovascular: Normal rate, regular rhythm. Grossly normal heart sounds.  Good peripheral  circulation. Respiratory: Normal respiratory effort.  No retractions. Lungs CTAB. Gastrointestinal: Soft and nontender. No distention. No abdominal bruits. No CVA tenderness. Musculoskeletal: Right lateral posterior ankle with mild effusion.  No warmth or erythema.  Decreased range of motion secondary to pain.  2+ distal pulses. Neurologic:  Normal speech and language. No gross focal neurologic deficits are appreciated.  Skin:  Skin is warm, dry and intact. No rash noted. Psychiatric: Mood and affect are normal. Speech and behavior are normal.  ____________________________________________   LABS (all labs ordered are listed, but only abnormal results are displayed)  Labs Reviewed  CBC - Abnormal; Notable for the following components:      Result Value   WBC 11.9 (*)    All other components within normal limits  COMPREHENSIVE METABOLIC PANEL - Abnormal; Notable for the following components:   Potassium 3.4 (*)    Glucose, Bld 116 (*)    Calcium 8.8 (*)    ALT 57 (*)    All other components within normal limits  TROPONIN I  LIPASE, BLOOD  URINALYSIS, COMPLETE (UACMP) WITH MICROSCOPIC  URINE DRUG SCREEN, QUALITATIVE ([facility] ONLY)  CBG MONITORING, ED   ____________________________________________  EKG  ED ECG REPORT I, SUNG,JADE J, the attending physician, personally viewed and interpreted this ECG.   Date: 05/12/2019  EKG Time: 2301  Rate: 90  Rhythm: normal EKG, normal sinus rhythm  Axis: RAD  Intervals:none  ST&T Change: Nonspecific  ____________________________________________  RADIOLOGY  ED MD interpretation: No acute cardiopulmonary process  Official radiology report(s): Dg Chest Port 1 View  Result Date: 05/12/2019 CLINICAL DATA:  48 year old male with syncope. EXAM: PORTABLE CHEST 1 VIEW COMPARISON:  None. FINDINGS: The heart size and mediastinal contours are within normal limits. Both lungs are clear. The visualized skeletal structures are unremarkable.  IMPRESSION: No active disease. Electronically Signed   By: Elgie Collard M.D.   On: 05/12/2019 00:31    ____________________________________________   PROCEDURES  Procedure(s) performed (including Critical Care):  Procedures   ____________________________________________   INITIAL IMPRESSION / ASSESSMENT AND PLAN / ED COURSE  As part of my medical decision making, I reviewed the following data within the electronic MEDICAL RECORD NUMBER Nursing notes reviewed and incorporated, Labs reviewed, EKG interpreted, Radiograph reviewed and Notes from prior ED visits     Scott Herrera was evaluated in Emergency Department on 05/12/2019 for the symptoms described in the history of present illness. He was evaluated in the context of the global COVID-19 pandemic, which necessitated consideration that the patient might be at risk for infection with the SARS-CoV-2 virus that causes COVID-19. Institutional protocols and  algorithms that pertain to the evaluation of patients at risk for COVID-19 are in a state of rapid change based on information released by regulatory bodies including the CDC and federal and state organizations. These policies and algorithms were followed during the patient's care in the ED.   48 year old male who presents status post syncopal episode after a long day of work in the heat and not eating.  Differential diagnosis includes but is not limited to dehydration, CVA, TIA, orthostasis, CAD, etc.  Will add CT head, orthostatics, check troponin, urinalysis and reassess.  Patient states he took off both lidocaine patches and feels much better and is eager to go home.  Clinical Course as of May 11 138  Fri May 12, 2019  0136 Patient declined further work-up including CT head.  Repeat orthostatics within normal limits.  States he feels fine and is eager for discharge home.  Strict return precautions given.  Patient verbalizes understanding and agrees with plan of care.   [JS]    Clinical  Course User Index [JS] Irean HongSung, Jade J, MD     ____________________________________________   FINAL CLINICAL IMPRESSION(S) / ED DIAGNOSES  Final diagnoses:  Syncope, unspecified syncope type  Orthostasis     ED Discharge Orders    None       Note:  This document was prepared using Dragon voice recognition software and may include unintentional dictation errors.   Irean HongSung, Jade J, MD 05/12/19 [phone]

## 2019-05-11 NOTE — ED Triage Notes (Signed)
Pt to ED via EMS from home. Pt had sycopal episode tonight after using bathrrom, pt was walking down hallway felt light headed and had loc, was caught by wife. Pt denies hitting head or use of blood thinners. Per ems pt was orthostatic on arrival, pts heart rate dropped to 28 with EMS. 1mg  atropine given. Pt denies any pain new tonight, states he has gout on right ankle pt was wearing 2 lidocaine patches on ankle.

## 2019-05-11 NOTE — ED Notes (Signed)
Pt uprite on stretcher in exam room with no distress noted; pt denies any c/o at present, denies any recent illness; card monitor in place; resp even/unlab, lungs clear, apical audible & regular, abd soft/nondist/nontender, strong & = periph pulses; pt reports onset dizziness while ambulating to BR; denies any fall or injury however; st he does have 2 lidocaine patches on rt foot for hx gout

## 2019-05-11 NOTE — ED Notes (Signed)
In exam room to complete further testing ordered by Dr Dolores Frame; pt requests no further tests until he is able to speak with a physician; Dr Dolores Frame notified

## 2019-05-12 ENCOUNTER — Emergency Department: Payer: BLUE CROSS/BLUE SHIELD

## 2019-05-12 LAB — COMPREHENSIVE METABOLIC PANEL
ALT: 57 U/L — ABNORMAL HIGH (ref 0–44)
AST: 34 U/L (ref 15–41)
Albumin: 4.2 g/dL (ref 3.5–5.0)
Alkaline Phosphatase: 77 U/L (ref 38–126)
Anion gap: 8 (ref 5–15)
BUN: 11 mg/dL (ref 6–20)
CO2: 23 mmol/L (ref 22–32)
Calcium: 8.8 mg/dL — ABNORMAL LOW (ref 8.9–10.3)
Chloride: 108 mmol/L (ref 98–111)
Creatinine, Ser: 1.19 mg/dL (ref 0.61–1.24)
GFR calc Af Amer: >60 mL/min (ref >60)
GFR calc non Af Amer: >60 mL/min (ref >60)
Glucose, Bld: 116 mg/dL — ABNORMAL HIGH (ref 70–99)
Potassium: 3.4 mmol/L — ABNORMAL LOW (ref 3.5–5.1)
Sodium: 139 mmol/L (ref 135–145)
Total Bilirubin: 0.9 mg/dL (ref 0.3–1.2)
Total Protein: 7.3 g/dL (ref 6.5–8.1)

## 2019-05-12 LAB — LIPASE, BLOOD: Lipase: 24 U/L (ref 11–51)

## 2019-05-12 NOTE — Discharge Instructions (Addendum)
1.  Drink plenty of fluids daily. 2.  Avoid outdoor heat for prolonged periods of time. 3.  Return to the ER for worsening symptoms, persistent vomiting, difficulty breathing or other concerns.

## 2019-05-12 NOTE — ED Notes (Signed)
Dr. Sung in to speak with pt  

## 2019-05-19 ENCOUNTER — Telehealth: Payer: Self-pay

## 2019-05-19 NOTE — Telephone Encounter (Signed)
Patient is schedule for ov with Dr. Milinda Pointer on 05/24/19 for right heel pain. He never went for MRI from last visit in 08/2018.     MRI has been reapproved from 05/19/2019 to 11/14/2019 Auth # [id number]

## 2019-05-19 NOTE — Telephone Encounter (Signed)
-----   Message from Simone Curia sent at 05/15/2019 10:23 AM EDT ----- Regarding: MRI Pt's wife called and wants to schedule an MRI for right ankle pain. Please call her back at [phone]. Her name is Scott Herrera.

## 2019-05-21 ENCOUNTER — Encounter: Payer: Self-pay | Admitting: Podiatry

## 2019-05-24 ENCOUNTER — Encounter: Payer: Self-pay | Admitting: Podiatry

## 2019-05-24 ENCOUNTER — Ambulatory Visit (INDEPENDENT_AMBULATORY_CARE_PROVIDER_SITE_OTHER): Payer: BLUE CROSS/BLUE SHIELD | Admitting: Podiatry

## 2019-05-24 ENCOUNTER — Other Ambulatory Visit: Payer: Self-pay

## 2019-05-24 VITALS — Temp 98.0°F

## 2019-05-24 DIAGNOSIS — M7661 Achilles tendinitis, right leg: Secondary | ICD-10-CM | POA: Diagnosis not present

## 2019-05-24 NOTE — Progress Notes (Signed)
He presents today for follow-up of continued right ankle pain.  He states that last week it was so bad his foot was completely swollen from his heel to his toes and that he had to use crutches he cannot even walk on the foot.  Denies any trauma.  Objective: Vital signs are stable he is alert and oriented x3 chronic Achilles tendinitis peroneal tendinitis and history of gout.  He has severe pain on palpation of the posterior and peroneal area of the ankle.  There is no longer any edema or erythema associated with the forefoot or midfoot.  Most of the pain is located in the subtalar joint area and the Achilles.  Assessment: Painful Achilles tendinitis chronic in nature probable tear.  Plan: This point we are requesting another MRI.

## 2019-05-29 ENCOUNTER — Other Ambulatory Visit: Payer: Self-pay

## 2019-05-29 ENCOUNTER — Ambulatory Visit
Admission: RE | Admit: 2019-05-29 | Discharge: 2019-05-29 | Disposition: A | Discharge status: Home / Self Care | Payer: BLUE CROSS/BLUE SHIELD | Source: Ambulatory Visit | Attending: Podiatry | Admitting: Podiatry

## 2019-05-29 DIAGNOSIS — M7661 Achilles tendinitis, right leg: Secondary | ICD-10-CM | POA: Diagnosis not present

## 2019-05-30 ENCOUNTER — Telehealth: Payer: Self-pay | Admitting: *Deleted

## 2019-05-30 NOTE — Telephone Encounter (Signed)
-----   Message from Max T Hyatt, DPM sent at 05/30/2019 12:04 PM EDT ----- Send for over read and inform patient of the delay 

## 2019-05-30 NOTE — Telephone Encounter (Signed)
-----   Message from Garrel Ridgel, [place] sent at 05/30/2019 12:04 PM EDT ----- Send for over read and inform patient of the delay

## 2019-05-30 NOTE — Telephone Encounter (Signed)
Request faxed to [facility] for MRI disc

## 2019-05-30 NOTE — Telephone Encounter (Signed)
Entered in error

## 2019-05-31 ENCOUNTER — Encounter: Payer: Self-pay | Admitting: Podiatry

## 2019-06-01 ENCOUNTER — Encounter: Payer: Self-pay | Admitting: Podiatry

## 2019-06-01 ENCOUNTER — Other Ambulatory Visit: Payer: Self-pay

## 2019-06-01 NOTE — Telephone Encounter (Signed)
I called and spoke with patient and informed him that Dr. Milinda Pointer is sending MRI for overread and it may take another 2-3 weeks to get the results back.  He verbalized understanding and will call back to schedule MRI follow up.

## 2019-06-01 NOTE — Telephone Encounter (Signed)
I spoke with wife and explained the process of getting overread, she verbalized understanding and I also recommended patient taking 600mg  Ibuprofen with ES Tylenol in the evening to help with pain.  She understood instructions and will call next week to schedule follow up appt for patient.

## 2019-06-16 ENCOUNTER — Encounter: Payer: Self-pay | Admitting: Podiatry

## 2019-06-16 NOTE — Telephone Encounter (Signed)
I spoke with patient and he said he was on a confrence call and was unable to talk, but he did stated he felt better and then hung up.  I will try to contact patient on Monday to follow up

## 2019-06-19 NOTE — Telephone Encounter (Signed)
I called and spoke with wife Amy, she stated that patient was doing better since stopping Aleve and Naproxen, he is taking Omeprazole daily.  I instructed for him to take ES Tylenol as needed for pain.  I also informed her that the MRI overread has been received and Dr. Milinda Pointer will go over all results at his next scheduled visit.   She verbalized understanding.

## 2019-06-26 ENCOUNTER — Ambulatory Visit: Payer: BLUE CROSS/BLUE SHIELD | Admitting: Podiatry

## 2019-06-28 ENCOUNTER — Other Ambulatory Visit: Payer: Self-pay

## 2019-06-28 ENCOUNTER — Encounter: Payer: Self-pay | Admitting: Podiatry

## 2019-06-28 ENCOUNTER — Ambulatory Visit (INDEPENDENT_AMBULATORY_CARE_PROVIDER_SITE_OTHER): Payer: BLUE CROSS/BLUE SHIELD | Admitting: Podiatry

## 2019-06-28 VITALS — Temp 98.6°F

## 2019-06-28 DIAGNOSIS — M109 Gout, unspecified: Secondary | ICD-10-CM | POA: Diagnosis not present

## 2019-06-28 DIAGNOSIS — M7661 Achilles tendinitis, right leg: Secondary | ICD-10-CM | POA: Diagnosis not present

## 2019-06-28 MED ORDER — COLCHICINE 0.6 MG PO TABS: ORAL_TABLET | ORAL | 3 refills | Status: AC

## 2019-06-28 MED ORDER — FEBUXOSTAT 80 MG PO TABS: 80.0000 mg | ORAL_TABLET | Freq: Every day | ORAL | 3 refills | Status: DC

## 2019-06-28 MED ORDER — METHYLPREDNISOLONE 4 MG PO TBPK: ORAL_TABLET | ORAL | 0 refills | Status: DC

## 2019-06-28 NOTE — Progress Notes (Signed)
Scott Herrera presents today for his MRI read.  He states that he has gout in his elbow as he demonstrates a very large red hot swollen elbow left.  He also has what he thinks is a flareup in his Achilles tendon.  States that his been taking his colchicine 3 times a day to help alleviate symptoms.  Objective: Vital signs are stable he is alert and oriented x3.  There is erythema and edema about the ankle left.  Pathology does demonstrate a normal Achilles but severe osteochondral defect to the left ankle joint itself.  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  Plan: Appears that he has chronic gout.  At this point I will start him on a Medrol Dosepak refill his colchicine and start him on Uloric 80 mg daily.  1 month I will do an arthritic profile and a complete metabolic panel and a CBC.

## 2019-08-02 ENCOUNTER — Ambulatory Visit: Payer: BLUE CROSS/BLUE SHIELD | Admitting: Podiatry

## 2019-08-07 ENCOUNTER — Encounter: Payer: Self-pay | Admitting: Podiatry

## 2019-09-04 ENCOUNTER — Encounter: Payer: Self-pay | Admitting: Podiatry

## 2019-09-04 DIAGNOSIS — M109 Gout, unspecified: Secondary | ICD-10-CM

## 2019-09-05 ENCOUNTER — Telehealth: Payer: Self-pay | Admitting: *Deleted

## 2019-09-05 NOTE — Telephone Encounter (Signed)
I called patient and informed him that the note has been sent to Dr. Milinda Pointer and I am waiting for recommendations..  I told him I would call him tomorrow and let him know what Dr. Milinda Pointer wants him to do.

## 2019-09-05 NOTE — Telephone Encounter (Signed)
Pt's wife, Amy states she is expecting a response for her husband's MyChart.

## 2019-09-05 NOTE — Telephone Encounter (Signed)
Please advise if you want him to try another medication or I can refer him to Rheumatology.

## 2019-09-07 NOTE — Telephone Encounter (Signed)
Patient is scheduled with Dr. Cassell Clement clinic rheumatology on 09/18/2019 @ 2:30

## 2019-10-10 ENCOUNTER — Ambulatory Visit: Payer: BLUE CROSS/BLUE SHIELD | Admitting: Rheumatology

## 2019-10-18 DIAGNOSIS — M1A00X0 Idiopathic chronic gout, unspecified site, without tophus (tophi): Secondary | ICD-10-CM | POA: Insufficient documentation

## 2019-10-18 DIAGNOSIS — Z79899 Other long term (current) drug therapy: Secondary | ICD-10-CM | POA: Insufficient documentation

## 2019-11-07 ENCOUNTER — Ambulatory Visit: Payer: BLUE CROSS/BLUE SHIELD | Admitting: Rheumatology

## 2020-02-21 DIAGNOSIS — R748 Abnormal levels of other serum enzymes: Secondary | ICD-10-CM | POA: Insufficient documentation

## 2020-02-21 DIAGNOSIS — Z1159 Encounter for screening for other viral diseases: Secondary | ICD-10-CM | POA: Insufficient documentation

## 2020-09-07 IMAGING — DX PORTABLE CHEST - 1 VIEW
1 series · 1 of 1 positions shown · non-contrast
Comparison: None.

CLINICAL DATA: 40-year-old male with syncope.

EXAM:
PORTABLE CHEST 1 VIEW

[chest ap]
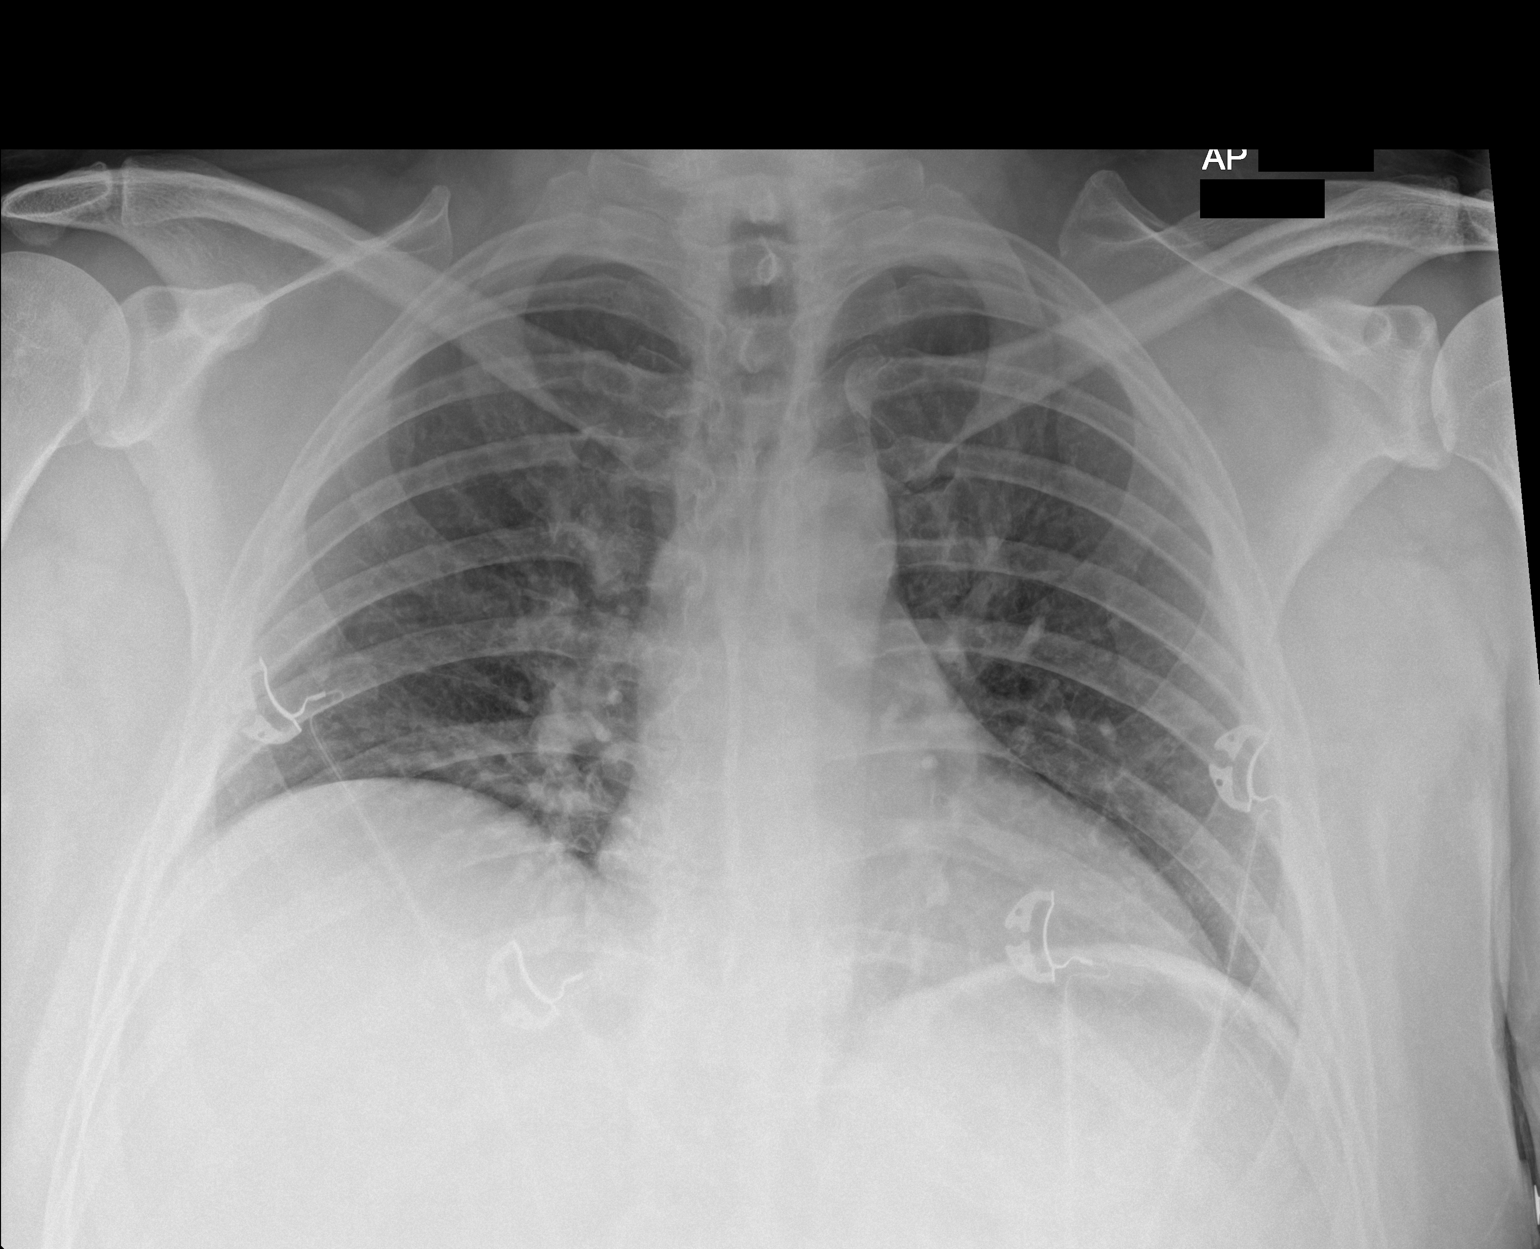

[1 of 1 positions shown; findings below may reference images not displayed]

FINDINGS: The heart size and mediastinal contours are within normal limits.
Both lungs are clear. The visualized skeletal structures are
unremarkable.
IMPRESSION: No active disease.

## 2020-10-09 ENCOUNTER — Encounter: Payer: Self-pay | Admitting: Podiatry

## 2020-10-09 ENCOUNTER — Ambulatory Visit: Payer: BLUE CROSS/BLUE SHIELD

## 2020-10-09 ENCOUNTER — Ambulatory Visit (INDEPENDENT_AMBULATORY_CARE_PROVIDER_SITE_OTHER): Payer: BLUE CROSS/BLUE SHIELD | Admitting: Podiatry

## 2020-10-09 ENCOUNTER — Other Ambulatory Visit: Payer: Self-pay

## 2020-10-09 DIAGNOSIS — M19079 Primary osteoarthritis, unspecified ankle and foot: Secondary | ICD-10-CM

## 2020-10-09 DIAGNOSIS — M109 Gout, unspecified: Secondary | ICD-10-CM | POA: Diagnosis not present

## 2020-10-09 NOTE — Progress Notes (Signed)
Scott Herrera presents today for follow-up of chronic pain in his right ankle.  He states that he has seen a rheumatologist who increased his daily dose of colchicine to 2 tablets and decreased is Uloric from 80 mg to 40 mg a day.  He states that he has cut out all of his sodas and started to watch what he eats but still has " flareups" that last for anywhere from a few minutes to several days.  He states that sometimes he feels that the ankle needs to pop and once it pops it feels 100% better immediately.  He states that most of the time that it is sore there is no swelling no redness no warmth.  Objective: Vital signs are stable he is alert and oriented x3 pulses are palpable neurologic sensorium is intact degenerative flexors are intact muscle strength is normal symmetrical.  Range of motion of the right ankle does demonstrate some crepitus with inversion and dorsiflexion at the level of the dorsal medial ankle.  The majority of his pain is posterior and lateral as well.  Reevaluated the MRI initial read and the over read.  Stating that there is a 2 cm OCD of the dorsal medial shoulder of the talus.  Assessment: What he describes certainly sounds less than gout more osteoarthritic in nature.  MRI does demonstrate osteochondral defects and small bony fragments within the capsule and joint that could result in some of his jamming sensation of the joint.  Plan: Discussed etiology pathology conservative versus surgical therapies.  At this point I feel the best thing to do is to refer him to Dr. Logan Bores for an ankle scope.  I expressed to him that with any luck the joint to be cleaned up and the OCD repaired.  I explained to him that this could also turn into an arthrotomy.  He understands this and is amenable to it he will follow up with Dr. Logan Bores November 9.  I have sent a note to Dr. Logan Bores informing him of this patient's condition and he will contact me with any questions regarding this patient.

## 2020-10-15 ENCOUNTER — Ambulatory Visit: Payer: BLUE CROSS/BLUE SHIELD | Admitting: Podiatry

## 2020-10-22 ENCOUNTER — Ambulatory Visit (INDEPENDENT_AMBULATORY_CARE_PROVIDER_SITE_OTHER): Payer: BLUE CROSS/BLUE SHIELD | Admitting: Podiatry

## 2020-10-22 ENCOUNTER — Other Ambulatory Visit: Payer: Self-pay

## 2020-10-22 DIAGNOSIS — M19079 Primary osteoarthritis, unspecified ankle and foot: Secondary | ICD-10-CM

## 2020-10-22 DIAGNOSIS — M958 Other specified acquired deformities of musculoskeletal system: Secondary | ICD-10-CM

## 2020-10-22 NOTE — Progress Notes (Signed)
   HPI: 49 y.o. male presenting today as a referral from Dr. Al Corpus, podiatry, for evaluation of chronic right ankle pain this been going on for several years intermittently.  Patient eventually had an MRI performed on 05/29/2019 due to the chronic ankle pain which diagnosed him with the osteochondral defect of the talus medial shoulder. He states that he has intermittent episodes that are increasingly painful for approximately 2 days.  He has not had any episode or pain in the ankle for over 1 month now.  He also has a history of gout which may compound and complicate his symptoms.  He presents for further treatment evaluation  Past Medical History:  Diagnosis Date  . Abnormal MRI, lumbar spine   . Allergy   . Arthritis   . Chicken pox   . Cyst of left kidney   . Frequent headaches   . GERD (gastroesophageal reflux disease)   . Migraine   . Splenomegaly      Physical Exam: General: The patient is alert and oriented x3 in no acute distress.  Dermatology: Skin is warm, dry and supple bilateral lower extremities. Negative for open lesions or macerations.  Vascular: Palpable pedal pulses bilaterally. No edema or erythema noted. Capillary refill within normal limits.  Neurological: Epicritic and protective threshold grossly intact bilaterally.   Musculoskeletal Exam: Range of motion within normal limits to all pedal and ankle joints bilateral. Muscle strength 5/5 in all groups bilateral.  Today the patient is asymptomatic and has no pain or tenderness associated to the ankle joint  MRI impression 05/29/2019: Ankle Joint: No joint effusion. Normal ankle mortise. 1.7 x 0.9 x 0.9 cm osteochondral lesion of the medial talar dome with cystic change along the interface between the fragment and parent bone.  1. Unstable 1.7 x 0.9 x 0.9 cm osteochondral lesion of the medial talar dome. 2. Normal Achilles tendon. 3. Small focal partial tear of the peroneal brevis tendon just inferior to the  lateral malleolus.  Assessment: 1.  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  History of acute gout right ankle   Plan of Care:  1. Patient evaluated.  MRI reviewed that was taken on 05/29/2019 2.  Today we discussed possible surgical intervention which would include repair of the OCD lesion with open arthrotomy.  Explained to the patient that this would not address the possibility of recurrent gout episodes.  He would also need to be nonweightbearing to the extremity for 6-8 weeks. 3.  After discussing in length the benefits and disadvantages and the fact that the patient has not had any episodes for over 1 month now we will continue with conservative treatment. 4.  Return to clinic as needed      Felecia Shelling, DPM [facility]  Dr. Felecia Shelling, DPM    [address] [place], [place] [address]                Office [phone]  Fax [phone]

## 2020-10-25 ENCOUNTER — Ambulatory Visit: Payer: BLUE CROSS/BLUE SHIELD | Admitting: Podiatry

## 2022-03-25 DIAGNOSIS — M1A00X0 Idiopathic chronic gout, unspecified site, without tophus (tophi): Secondary | ICD-10-CM | POA: Diagnosis not present

## 2022-03-25 DIAGNOSIS — M25461 Effusion, right knee: Secondary | ICD-10-CM | POA: Diagnosis not present

## 2022-03-25 DIAGNOSIS — Z79899 Other long term (current) drug therapy: Secondary | ICD-10-CM | POA: Diagnosis not present

## 2022-03-25 DIAGNOSIS — M25561 Pain in right knee: Secondary | ICD-10-CM | POA: Diagnosis not present

## 2022-03-25 DIAGNOSIS — R748 Abnormal levels of other serum enzymes: Secondary | ICD-10-CM | POA: Diagnosis not present

## 2022-03-25 DIAGNOSIS — M13861 Other specified arthritis, right knee: Secondary | ICD-10-CM | POA: Diagnosis not present

## 2022-10-06 DIAGNOSIS — R748 Abnormal levels of other serum enzymes: Secondary | ICD-10-CM | POA: Diagnosis not present

## 2022-10-06 DIAGNOSIS — Z79899 Other long term (current) drug therapy: Secondary | ICD-10-CM | POA: Diagnosis not present

## 2022-10-06 DIAGNOSIS — M1A00X0 Idiopathic chronic gout, unspecified site, without tophus (tophi): Secondary | ICD-10-CM | POA: Diagnosis not present

## 2023-01-28 DIAGNOSIS — M25561 Pain in right knee: Secondary | ICD-10-CM | POA: Diagnosis not present

## 2023-02-16 DIAGNOSIS — M25561 Pain in right knee: Secondary | ICD-10-CM | POA: Diagnosis not present

## 2023-02-24 DIAGNOSIS — M25561 Pain in right knee: Secondary | ICD-10-CM | POA: Diagnosis not present

## 2023-03-03 DIAGNOSIS — M10061 Idiopathic gout, right knee: Secondary | ICD-10-CM | POA: Diagnosis not present
